# Patient Record
Sex: Male | Born: 1990 | Race: White | Hispanic: Yes | Marital: Single | Smoking: Never smoker
Health system: Southern US, Community
[De-identification: ages and names within clinical notes are randomized; demographics above are authoritative.]

---
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
## 2011-04-19 ENCOUNTER — Emergency Department (HOSPITAL_COMMUNITY)
Admission: EM | Admit: 2011-04-19 | Discharge: 2011-04-19 | Disposition: A | Discharge status: Home / Self Care | Payer: Self-pay | Attending: Emergency Medicine | Admitting: Emergency Medicine

## 2011-04-19 ENCOUNTER — Emergency Department (HOSPITAL_COMMUNITY): Payer: Self-pay

## 2011-04-19 DIAGNOSIS — R05 Cough: Secondary | ICD-10-CM | POA: Insufficient documentation

## 2011-04-19 DIAGNOSIS — R509 Fever, unspecified: Secondary | ICD-10-CM | POA: Insufficient documentation

## 2011-04-19 DIAGNOSIS — R51 Headache: Secondary | ICD-10-CM | POA: Insufficient documentation

## 2011-04-19 DIAGNOSIS — J029 Acute pharyngitis, unspecified: Secondary | ICD-10-CM | POA: Insufficient documentation

## 2011-04-19 DIAGNOSIS — R079 Chest pain, unspecified: Secondary | ICD-10-CM | POA: Insufficient documentation

## 2011-04-19 DIAGNOSIS — R059 Cough, unspecified: Secondary | ICD-10-CM | POA: Insufficient documentation

## 2011-04-19 LAB — CBC
MCH: 30.9 pg (ref 26.0–34.0)
MCHC: 34.4 g/dL (ref 30.0–36.0)
Platelets: 204 10*3/uL (ref 150–400)
RBC: 4.33 MIL/uL (ref 4.22–5.81)
RDW: 12.5 % (ref 11.5–15.5)
WBC: 6.4 10*3/uL (ref 4.0–10.5)

## 2011-04-19 LAB — DIFFERENTIAL
Basophils Absolute: 0 10*3/uL (ref 0.0–0.1)
Eosinophils Absolute: 0 10*3/uL (ref 0.0–0.7)
Monocytes Absolute: 1 10*3/uL (ref 0.1–1.0)
Neutro Abs: 4.7 10*3/uL (ref 1.7–7.7)

## 2011-04-19 LAB — COMPREHENSIVE METABOLIC PANEL
AST: 39 U/L — ABNORMAL HIGH (ref 0–37)
Albumin: 3.4 g/dL — ABNORMAL LOW (ref 3.5–5.2)
CO2: 24 mEq/L (ref 19–32)
Calcium: 8.3 mg/dL — ABNORMAL LOW (ref 8.4–10.5)
Chloride: 103 mEq/L (ref 96–112)
Creatinine, Ser: 0.96 mg/dL (ref 0.4–1.5)
GFR calc Af Amer: >60 mL/min (ref >60)
Potassium: 3.4 mEq/L — ABNORMAL LOW (ref 3.5–5.1)
Total Bilirubin: 0.4 mg/dL (ref 0.3–1.2)

## 2011-04-19 LAB — URINE MICROSCOPIC-ADD ON

## 2011-04-19 LAB — URINALYSIS, ROUTINE W REFLEX MICROSCOPIC
Leukocytes, UA: NEGATIVE
Protein, ur: NEGATIVE mg/dL
Urobilinogen, UA: 0.2 mg/dL (ref 0.0–1.0)

## 2011-04-19 LAB — RAPID STREP SCREEN (MED CTR MEBANE ONLY): Streptococcus, Group A Screen (Direct): NEGATIVE

## 2020-09-06 ENCOUNTER — Emergency Department (HOSPITAL_BASED_OUTPATIENT_CLINIC_OR_DEPARTMENT_OTHER)
Admission: EM | Admit: 2020-09-06 | Discharge: 2020-09-06 | Disposition: A | Discharge status: Home / Self Care | Payer: BC Managed Care – PPO | Attending: Emergency Medicine | Admitting: Emergency Medicine

## 2020-09-06 ENCOUNTER — Emergency Department (HOSPITAL_BASED_OUTPATIENT_CLINIC_OR_DEPARTMENT_OTHER): Payer: BC Managed Care – PPO

## 2020-09-06 ENCOUNTER — Encounter (HOSPITAL_BASED_OUTPATIENT_CLINIC_OR_DEPARTMENT_OTHER): Payer: Self-pay

## 2020-09-06 ENCOUNTER — Other Ambulatory Visit: Payer: Self-pay

## 2020-09-06 DIAGNOSIS — K5900 Constipation, unspecified: Secondary | ICD-10-CM | POA: Diagnosis not present

## 2020-09-06 DIAGNOSIS — R1084 Generalized abdominal pain: Secondary | ICD-10-CM | POA: Diagnosis present

## 2020-09-06 LAB — COMPREHENSIVE METABOLIC PANEL
ALT: 165 U/L — ABNORMAL HIGH (ref 0–44)
AST: 82 U/L — ABNORMAL HIGH (ref 15–41)
Albumin: 4.4 g/dL (ref 3.5–5.0)
Alkaline Phosphatase: 87 U/L (ref 38–126)
Anion gap: 11 (ref 5–15)
BUN: 12 mg/dL (ref 6–20)
CO2: 25 mmol/L (ref 22–32)
Calcium: 9.3 mg/dL (ref 8.9–10.3)
Chloride: 101 mmol/L (ref 98–111)
Creatinine, Ser: 0.85 mg/dL (ref 0.61–1.24)
GFR calc Af Amer: >60 mL/min (ref >60)
GFR calc non Af Amer: >60 mL/min (ref >60)
Glucose, Bld: 110 mg/dL — ABNORMAL HIGH (ref 70–99)
Potassium: 3.7 mmol/L (ref 3.5–5.1)
Sodium: 137 mmol/L (ref 135–145)
Total Bilirubin: 0.5 mg/dL (ref 0.3–1.2)
Total Protein: 7.9 g/dL (ref 6.5–8.1)

## 2020-09-06 LAB — CBC
HCT: 44.7 % (ref 39.0–52.0)
Hemoglobin: 15.2 g/dL (ref 13.0–17.0)
MCH: 30.9 pg (ref 26.0–34.0)
MCHC: 34 g/dL (ref 30.0–36.0)
MCV: 90.9 fL (ref 80.0–100.0)
Platelets: 311 10*3/uL (ref 150–400)
RBC: 4.92 MIL/uL (ref 4.22–5.81)
RDW: 12.3 % (ref 11.5–15.5)
WBC: 8.6 10*3/uL (ref 4.0–10.5)
nRBC: 0 % (ref 0.0–0.2)

## 2020-09-06 LAB — URINALYSIS, ROUTINE W REFLEX MICROSCOPIC
Bilirubin Urine: NEGATIVE
Glucose, UA: NEGATIVE mg/dL
Hgb urine dipstick: NEGATIVE
Ketones, ur: NEGATIVE mg/dL
Leukocytes,Ua: NEGATIVE
Nitrite: NEGATIVE
Protein, ur: NEGATIVE mg/dL
Specific Gravity, Urine: 1.01 (ref 1.005–1.030)
pH: 7.5 (ref 5.0–8.0)

## 2020-09-06 LAB — LIPASE, BLOOD: Lipase: 37 U/L (ref 11–51)

## 2020-09-06 MED ORDER — ACETAMINOPHEN 500 MG PO TABS
1000.0000 mg | ORAL_TABLET | Freq: Once | ORAL | Status: AC
  Administered 2020-09-06: 1000 mg via ORAL
  Filled 2020-09-06: qty 2

## 2020-09-06 MED ORDER — LIDOCAINE 5 % EX PTCH
2.0000 | MEDICATED_PATCH | CUTANEOUS | Status: DC
  Administered 2020-09-06: 2 via TRANSDERMAL
  Filled 2020-09-06: qty 2

## 2020-09-06 MED ORDER — IBUPROFEN 400 MG PO TABS
400.0000 mg | ORAL_TABLET | Freq: Once | ORAL | Status: AC
  Administered 2020-09-06: 400 mg via ORAL
  Filled 2020-09-06: qty 1

## 2020-09-06 MED ORDER — POLYETHYLENE GLYCOL 3350 17 G PO PACK: 17.0000 g | PACK | Freq: Every day | ORAL | 0 refills | Status: DC

## 2020-09-06 NOTE — ED Triage Notes (Signed)
Pt c/o abd pain, constipation-sx started 9/18-states pain feels like "sore abs"-NAD-steady gait

## 2020-09-06 NOTE — ED Provider Notes (Signed)
[facility] EMERGENCY DEPARTMENT Provider Note   CSN: [id number] Arrival date & time: 09/06/20  2011     History Chief Complaint  Patient presents with  . Abdominal Pain    Andre Bryant is a 29 y.o. male.  The history is provided by the patient.  Abdominal Pain Pain location:  Generalized Pain radiates to:  Does not radiate Pain severity:  Moderate Onset quality:  Gradual Timing:  Constant Progression:  Unchanged Chronicity:  New Context: not eating, not laxative use, not sick contacts, not suspicious food intake and not trauma   Relieved by:  Nothing Worsened by:  Nothing Ineffective treatments:  None tried Associated symptoms: constipation   Associated symptoms: no anorexia, no belching, no chest pain, no chills, no cough, no dysuria, no fatigue, no fever, no flatus, no hematemesis, no hematochezia, no melena, no nausea, no shortness of breath, no sore throat and no vomiting   Risk factors: no alcohol abuse   Patient has been constipated since the 18th, no Bm.  Also feels sore in the abdomen but hasn't taken anything.  No f/c/r.  No n/v/d.  No urinary symptoms.       History reviewed. No pertinent past medical history.  There are no problems to display for this patient.   History reviewed. No pertinent surgical history.     History reviewed. No pertinent family history.  Social History   Tobacco Use  . Smoking status: Never Smoker  . Smokeless tobacco: Never Used  Vaping Use  . Vaping Use: Never used  Substance Use Topics  . Alcohol use: Yes    Comment: weekly  . Drug use: Never    Home Medications Prior to Admission medications   Not on File    Allergies    Patient has no known allergies.  Review of Systems   Review of Systems  Constitutional: Negative for chills, fatigue and fever.  HENT: Negative for sore throat.   Eyes: Negative for visual disturbance.  Respiratory: Negative for cough and shortness of breath.     Cardiovascular: Negative for chest pain.  Gastrointestinal: Positive for abdominal pain and constipation. Negative for anorexia, flatus, hematemesis, hematochezia, melena, nausea and vomiting.  Genitourinary: Negative for dysuria.  Musculoskeletal: Negative for arthralgias.  Neurological: Negative for dizziness.  Psychiatric/Behavioral: Negative for agitation.  All other systems reviewed and are negative.   Physical Exam Updated Vital Signs BP 115/65   Pulse 64   Temp 98.3 F (36.8 C) (Oral)   Resp 16   Ht 5\' 10"  (1.778 m)   Wt 103.4 kg   SpO2 99%   BMI 32.71 kg/m   Physical Exam Vitals and nursing note reviewed.  Constitutional:      General: He is not in acute distress.    Appearance: Normal appearance.  HENT:     Head: Normocephalic and atraumatic.     Nose: Nose normal.  Eyes:     Conjunctiva/sclera: Conjunctivae normal.     Pupils: Pupils are equal, round, and reactive to light.  Cardiovascular:     Rate and Rhythm: Normal rate and regular rhythm.     Pulses: Normal pulses.     Heart sounds: Normal heart sounds.  Pulmonary:     Effort: Pulmonary effort is normal.     Breath sounds: Normal breath sounds.  Abdominal:     General: Abdomen is flat. Bowel sounds are normal.     Palpations: Abdomen is soft.     Tenderness: There is no abdominal  tenderness. There is no guarding or rebound.  Musculoskeletal:        General: Normal range of motion.     Cervical back: Normal range of motion and neck supple.  Skin:    General: Skin is warm and dry.     Capillary Refill: Capillary refill takes less than 2 seconds.  Neurological:     General: No focal deficit present.     Mental Status: He is alert and oriented to person, place, and time.     Deep Tendon Reflexes: Reflexes normal.  Psychiatric:        Mood and Affect: Mood normal.        Behavior: Behavior normal.     ED Results / Procedures / Treatments   Labs (all labs ordered are listed, but only abnormal  results are displayed) Results for orders placed or performed during the hospital encounter of 09/06/20  Lipase, blood  Result Value Ref Range   Lipase 37 11 - 51 U/L  Comprehensive metabolic panel  Result Value Ref Range   Sodium 137 135 - 145 mmol/L   Potassium 3.7 3.5 - 5.1 mmol/L   Chloride 101 98 - 111 mmol/L   CO2 25 22 - 32 mmol/L   Glucose, Bld 110 (H) 70 - 99 mg/dL   BUN 12 6 - 20 mg/dL   Creatinine, Ser 7.37 0.61 - 1.24 mg/dL   Calcium 9.3 8.9 - 10.6 mg/dL   Total Protein 7.9 6.5 - 8.1 g/dL   Albumin 4.4 3.5 - 5.0 g/dL   AST 82 (H) 15 - 41 U/L   ALT 165 (H) 0 - 44 U/L   Alkaline Phosphatase 87 38 - 126 U/L   Total Bilirubin 0.5 0.3 - 1.2 mg/dL   GFR calc non Af Amer >60 >60 mL/min   GFR calc Af Amer >60 >60 mL/min   Anion gap 11 5 - 15  CBC  Result Value Ref Range   WBC 8.6 4.0 - 10.5 K/uL   RBC 4.92 4.22 - 5.81 MIL/uL   Hemoglobin 15.2 13.0 - 17.0 g/dL   HCT 26.9 39 - 52 %   MCV 90.9 80.0 - 100.0 fL   MCH 30.9 26.0 - 34.0 pg   MCHC 34.0 30.0 - 36.0 g/dL   RDW 48.5 46.2 - 70.3 %   Platelets 311 150 - 400 K/uL   nRBC 0.0 0.0 - 0.2 %  Urinalysis, Routine w reflex microscopic Urine, Clean Catch  Result Value Ref Range   Color, Urine YELLOW YELLOW   APPearance CLEAR CLEAR   Specific Gravity, Urine 1.010 1.005 - 1.030   pH 7.5 5.0 - 8.0   Glucose, UA NEGATIVE NEGATIVE mg/dL   Hgb urine dipstick NEGATIVE NEGATIVE   Bilirubin Urine NEGATIVE NEGATIVE   Ketones, ur NEGATIVE NEGATIVE mg/dL   Protein, ur NEGATIVE NEGATIVE mg/dL   Nitrite NEGATIVE NEGATIVE   Leukocytes,Ua NEGATIVE NEGATIVE   DG Abdomen Acute W/Chest  Result Date: 09/06/2020 CLINICAL DATA:  Constipation EXAM: ACUTE ABDOMEN SERIES (2 VIEW ABDOMEN AND 1 VIEW CHEST) COMPARISON:  None. FINDINGS: Lungs are clear. No pneumothorax or pleural effusion. Cardiac size within normal limits. Pulmonary vascularity is normal. Normal abdominal gas pattern. No free intraperitoneal gas. No organomegaly. No abnormal  calcifications within the abdomen and pelvis. Osseous structures are unremarkable. IMPRESSION: Lungs are clear. Normal abdominal gas pattern. Electronically Signed   By: Helyn Numbers MD   On: 09/06/2020 23:15    Radiology DG Abdomen Acute W/Chest  Result  Date: 09/06/2020 CLINICAL DATA:  Constipation EXAM: ACUTE ABDOMEN SERIES (2 VIEW ABDOMEN AND 1 VIEW CHEST) COMPARISON:  None. FINDINGS: Lungs are clear. No pneumothorax or pleural effusion. Cardiac size within normal limits. Pulmonary vascularity is normal. Normal abdominal gas pattern. No free intraperitoneal gas. No organomegaly. No abnormal calcifications within the abdomen and pelvis. Osseous structures are unremarkable. IMPRESSION: Lungs are clear. Normal abdominal gas pattern. Electronically Signed   By: Helyn Numbers MD   On: 09/06/2020 23:15    Procedures Procedures (including critical care time)  Medications Ordered in ED Medications  acetaminophen (TYLENOL) tablet 1,000 mg (has no administration in time range)  ibuprofen (ADVIL) tablet 400 mg (has no administration in time range)  lidocaine (LIDODERM) 5 % 2 patch (has no administration in time range)    ED Course  I have reviewed the triage vital signs and the nursing notes.  Pertinent labs & imaging results that were available during my care of the patient were reviewed by me and considered in my medical decision making (see chart for details).    Well appearing. Exam vitals and labs are reassuring.  No signs of a surgical abdomen.  No fevers.  I do not believe this patient has appendicitis or biliary colic.  I will start miralax daily and have advised tylenol for pain.  Strict abdominal pain return precautions were given.    JERMAYNE SWEENEY was evaluated in Emergency Department on 09/06/2020 for the symptoms described in the history of present illness. He was evaluated in the context of the global COVID-19 pandemic, which necessitated consideration that the patient might be  at risk for infection with the SARS-CoV-2 virus that causes COVID-19. Institutional protocols and algorithms that pertain to the evaluation of patients at risk for COVID-19 are in a state of rapid change based on information released by regulatory bodies including the CDC and federal and state organizations. These policies and algorithms were followed during the patient's care in the ED.   Final Clinical Impression(s) / ED Diagnoses Return for intractable cough, coughing up blood,fevers >100.4 unrelieved by medication, shortness of breath, intractable vomiting, chest pain, shortness of breath, weakness,numbness, changes in speech, facial asymmetry,abdominal pain, passing out,Inability to tolerate liquids or food, cough, altered mental status or any concerns. No signs of systemic illness or infection. The patient is nontoxic-appearing on exam and vital signs are within normal limits.   I have reviewed the triage vital signs and the nursing notes. Pertinent labs &imaging results that were available during my care of the patient were reviewed by me and considered in my medical decision making (see chart for details).After history, exam, and medical workup I feel the patient has beenappropriately medically screened and is safe for discharge home. Pertinent diagnoses were discussed with the patient. Patient was given return precautions.   Ikesha Siller, MD 09/06/20 [id number]

## 2021-04-13 IMAGING — DX DG ABDOMEN ACUTE W/ 1V CHEST
4 series · 4 of 4 positions shown · non-contrast
Comparison: None.

CLINICAL DATA: Constipation

EXAM:
ACUTE ABDOMEN SERIES (2 VIEW ABDOMEN AND 1 VIEW CHEST)

[abdomen supine (1 of 2)]
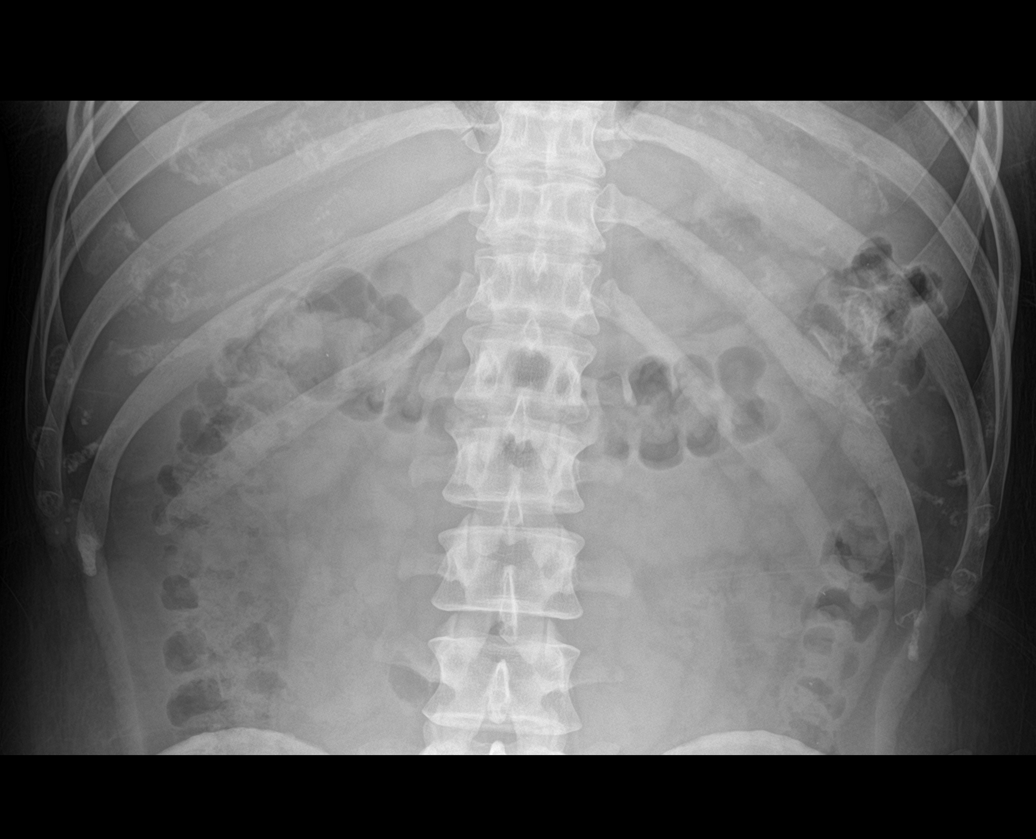

[chest pa]
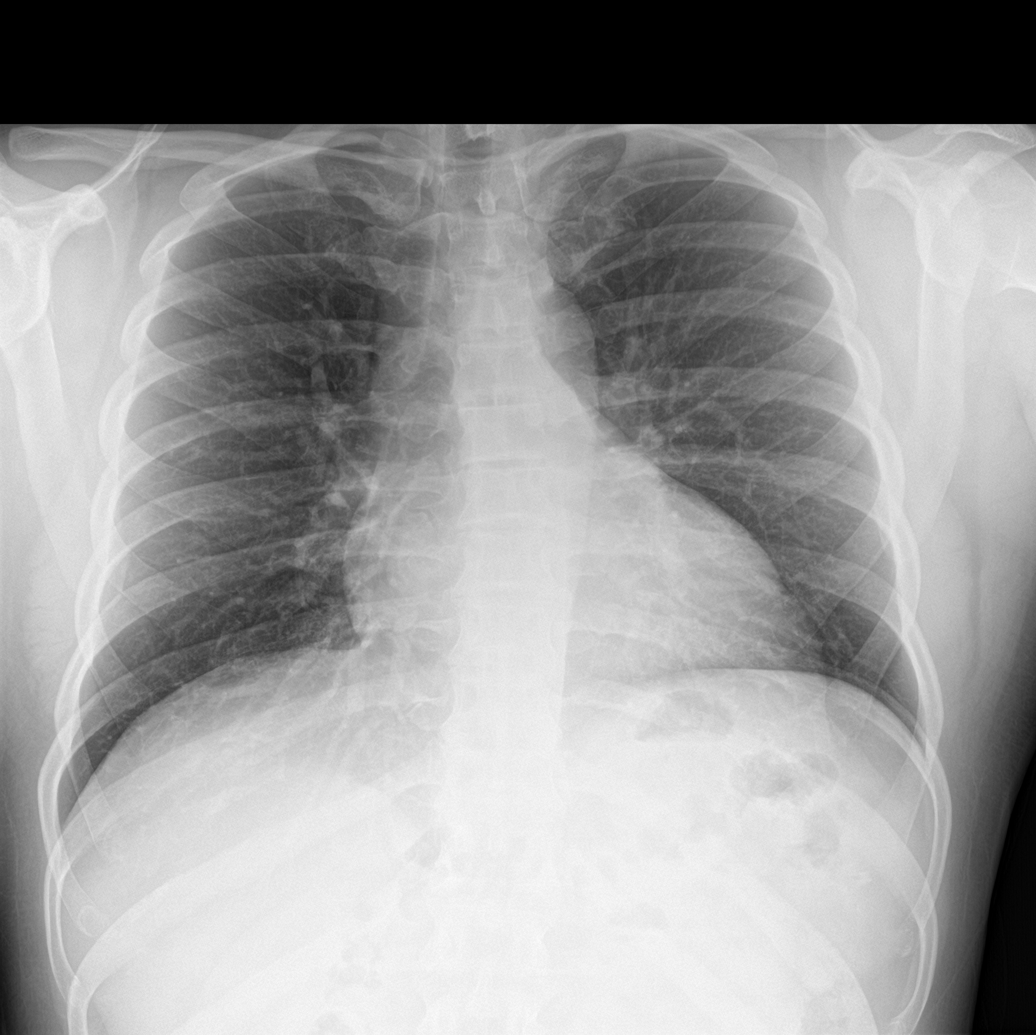

[abdomen erect]
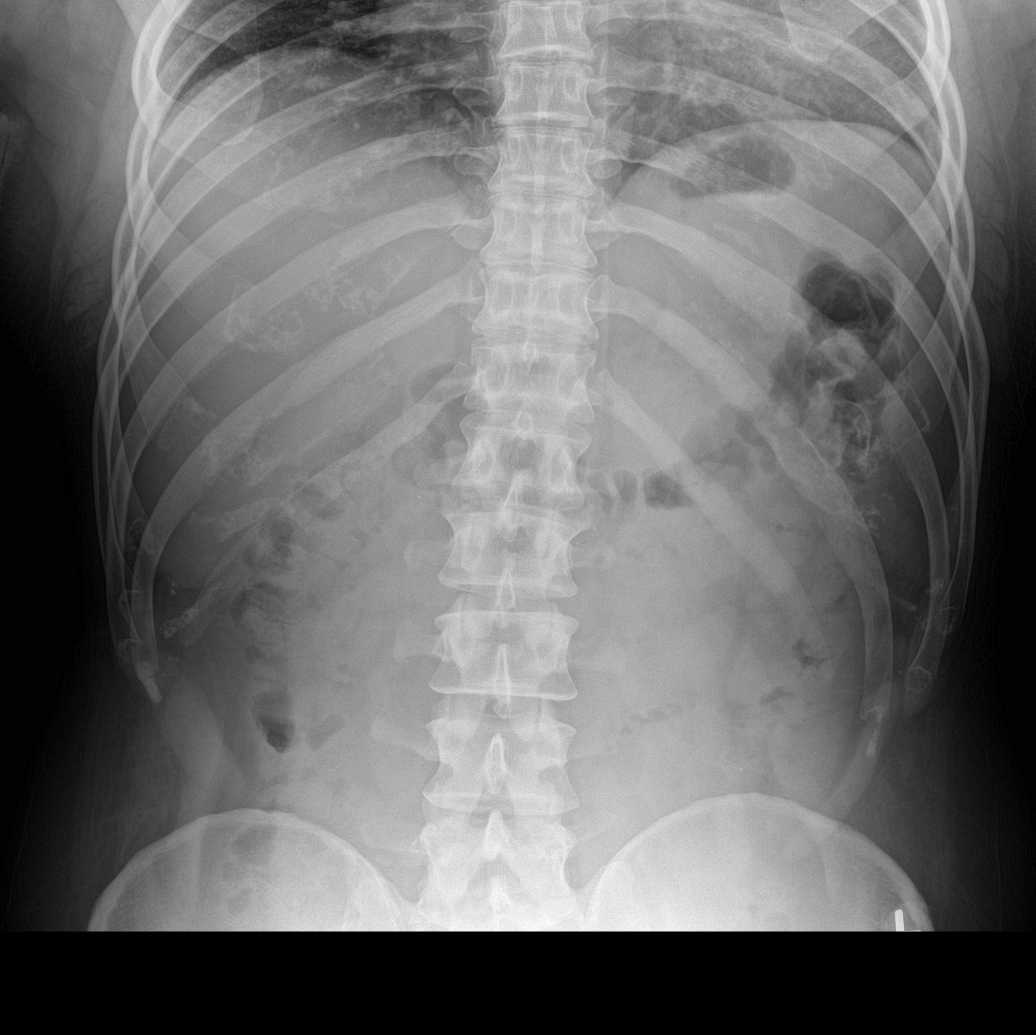

[abdomen supine (2 of 2)]
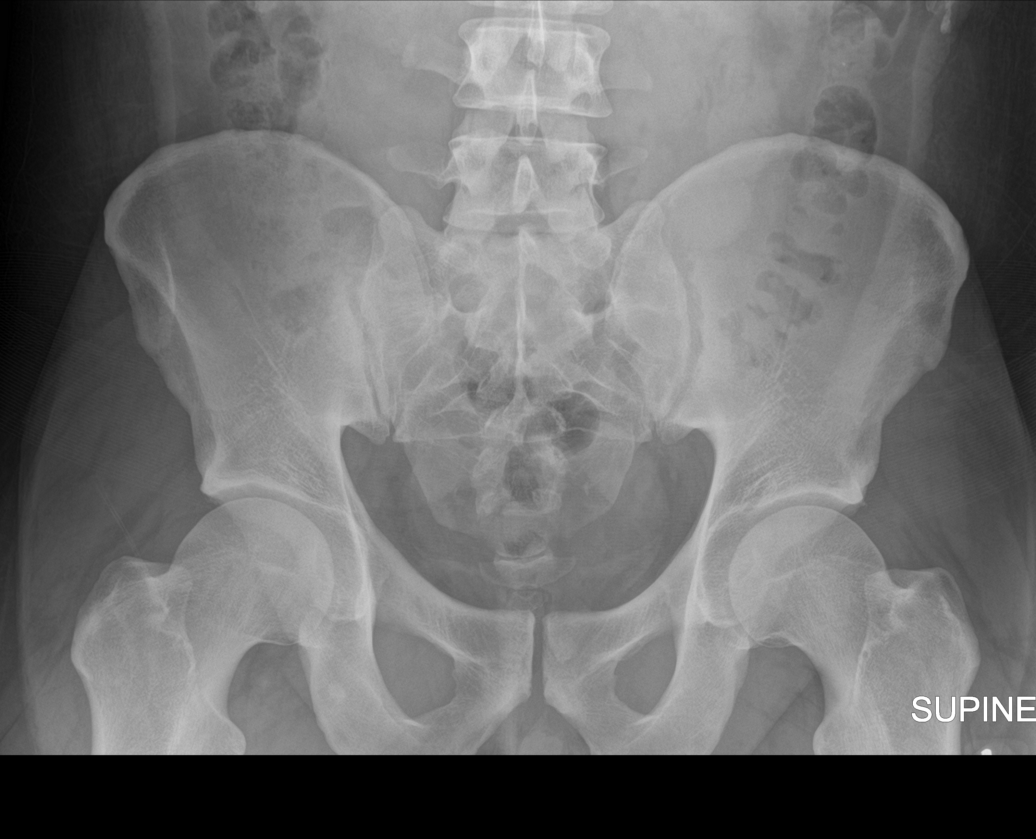

[4 of 4 positions shown; findings below may reference images not displayed]

FINDINGS: Lungs are clear. No pneumothorax or pleural effusion. Cardiac size
within normal limits. Pulmonary vascularity is normal.

Normal abdominal gas pattern. No free intraperitoneal gas. No
organomegaly. No abnormal calcifications within the abdomen and
pelvis. Osseous structures are unremarkable.
IMPRESSION: Lungs are clear.

Normal abdominal gas pattern.

## 2024-10-04 ENCOUNTER — Encounter: Payer: Self-pay | Admitting: Family Medicine

## 2024-10-04 ENCOUNTER — Ambulatory Visit: Payer: Self-pay | Admitting: Family Medicine

## 2024-10-04 ENCOUNTER — Ambulatory Visit: Payer: Self-pay

## 2024-10-04 ENCOUNTER — Ambulatory Visit (INDEPENDENT_AMBULATORY_CARE_PROVIDER_SITE_OTHER): Admitting: Family Medicine

## 2024-10-04 ENCOUNTER — Ambulatory Visit (INDEPENDENT_AMBULATORY_CARE_PROVIDER_SITE_OTHER)

## 2024-10-04 VITALS — BP 118/76 | HR 61 | Temp 98.0°F | Ht 70.0 in | Wt 229.6 lb

## 2024-10-04 DIAGNOSIS — M5442 Lumbago with sciatica, left side: Secondary | ICD-10-CM | POA: Diagnosis not present

## 2024-10-04 DIAGNOSIS — Z7689 Persons encountering health services in other specified circumstances: Secondary | ICD-10-CM

## 2024-10-04 LAB — COMPREHENSIVE METABOLIC PANEL WITH GFR
ALT: 141 U/L — ABNORMAL HIGH (ref 0–53)
AST: 70 U/L — ABNORMAL HIGH (ref 0–37)
Albumin: 4.7 g/dL (ref 3.5–5.2)
Alkaline Phosphatase: 93 U/L (ref 39–117)
BUN: 10 mg/dL (ref 6–23)
CO2: 28 meq/L (ref 19–32)
Calcium: 9.4 mg/dL (ref 8.4–10.5)
Chloride: 104 meq/L (ref 96–112)
Creatinine, Ser: 0.78 mg/dL (ref 0.40–1.50)
GFR: 116.29 mL/min (ref >60.00)
Glucose, Bld: 76 mg/dL (ref 70–99)
Potassium: 3.7 meq/L (ref 3.5–5.1)
Sodium: 139 meq/L (ref 135–145)
Total Bilirubin: 0.4 mg/dL (ref 0.2–1.2)
Total Protein: 7.8 g/dL (ref 6.0–8.3)

## 2024-10-04 LAB — CBC WITH DIFFERENTIAL/PLATELET
Basophils Absolute: 0 K/uL (ref 0.0–0.1)
Basophils Relative: 0.4 % (ref 0.0–3.0)
Eosinophils Absolute: 0.2 K/uL (ref 0.0–0.7)
Eosinophils Relative: 2.9 % (ref 0.0–5.0)
HCT: 44.9 % (ref 39.0–52.0)
Hemoglobin: 15.1 g/dL (ref 13.0–17.0)
Lymphocytes Relative: 27.1 % (ref 12.0–46.0)
Lymphs Abs: 1.9 K/uL (ref 0.7–4.0)
MCHC: 33.5 g/dL (ref 30.0–36.0)
MCV: 91.7 fl (ref 78.0–100.0)
Monocytes Absolute: 0.6 K/uL (ref 0.1–1.0)
Monocytes Relative: 8.1 % (ref 3.0–12.0)
Neutro Abs: 4.4 K/uL (ref 1.4–7.7)
Neutrophils Relative %: 61.5 % (ref 43.0–77.0)
Platelets: 292 K/uL (ref 150.0–400.0)
RBC: 4.89 Mil/uL (ref 4.22–5.81)
RDW: 13.2 % (ref 11.5–15.5)
WBC: 7.1 K/uL (ref 4.0–10.5)

## 2024-10-04 NOTE — Patient Instructions (Addendum)
 I have attached exercises to try for your back.   I have sent in meloxicam for you to take once daily. This is an anti-inflammatory.   Do not take ibuprofen , Aleve, aspirin with this medication. You may take Tylenol  with this medication.  Eat with this medication, it can upset your stomach if you do not.  We are getting an xray today. We will be in contact with any abnormal results that require further attention.  We are checking labs today, will be in contact with any results that require further attention  Follow-up with me for new or worsening symptoms.

## 2024-10-04 NOTE — Progress Notes (Signed)
 New Patient Visit  Subjective:     Patient ID: Andre Bryant, male    DOB: 01/13/1991, 33 y.o.   MRN: [id number]  No chief complaint on file.   HPI  Discussed the use of AI scribe software for clinical note transcription with the patient, who gave verbal consent to proceed.  History of Present Illness Andre Bryant is a 33 year old male who presents with lower back pain.  Lower back pain - Onset four weeks ago with sudden onset - Persistent pain localized to a 'strip' in the lower back - Locking sensation in the lower back - Pain restricts movement, especially when lifting his leg - Tingling sensation present, but no numbness - History of a pinched nerve, initially suspected as the cause  Prior interventions and pain management - Completed six chiropractic treatments over five weeks without significant relief - Received one deep tissue massage without significant relief - Uses heat and ice for pain management - Has not taken any medications, including prescribed muscle relaxers  Alcohol use - Consumes a case of alcohol per week - Does not consider this amount excessive     ROS Per HPI  Outpatient Encounter Medications as of 10/04/2024  Medication Sig   [DISCONTINUED] polyethylene glycol (MIRALAX ) 17 g packet Take 17 g by mouth daily. (Patient not taking: Reported on 10/04/2024)   No facility-administered encounter medications on file as of 10/04/2024.    History reviewed. No pertinent past medical history.  History reviewed. No pertinent surgical history.  History reviewed. No pertinent family history.  Social History   Socioeconomic History   Marital status: Single    Spouse name: Not on file   Number of children: Not on file   Years of education: Not on file   Highest education level: Not on file  Occupational History   Not on file  Tobacco Use   Smoking status: Never   Smokeless tobacco: Never  Vaping Use   Vaping status: Never Used   Substance and Sexual Activity   Alcohol use: Yes    Comment: weekly   Drug use: Never   Sexual activity: Not on file  Other Topics Concern   Not on file  Social History Narrative   Not on file   Social Drivers of Health   Financial Resource Strain: Not on file  Food Insecurity: Not on file  Transportation Needs: Not on file  Physical Activity: Not on file  Stress: Not on file  Social Connections: Unknown (04/29/2022)   Received from [facility]   Social Network    Social Network: Not on file  Intimate Partner Violence: Unknown (03/21/2022)   Received from [facility]   HITS    Physically Hurt: Not on file    Insult or Talk Down To: Not on file    Threaten Physical Harm: Not on file    Scream or Curse: Not on file       Objective:    BP 118/76 (BP Location: Left Arm, Patient Position: Sitting)   Pulse 61   Temp 98 F (36.7 C) (Temporal)   Ht 5' 10 (1.778 m)   Wt 229 lb 9.6 oz (104.1 kg)   SpO2 98%   BMI 32.94 kg/m    Physical Exam Vitals and nursing note reviewed.  Constitutional:      General: He is not in acute distress.    Appearance: Normal appearance.  HENT:     Head: Normocephalic and atraumatic.  Right Ear: External ear normal.     Left Ear: External ear normal.     Nose: Nose normal.     Mouth/Throat:     Mouth: Mucous membranes are moist.     Pharynx: Oropharynx is clear.  Eyes:     Extraocular Movements: Extraocular movements intact.  Cardiovascular:     Rate and Rhythm: Normal rate and regular rhythm.     Pulses: Normal pulses.     Heart sounds: Normal heart sounds.  Pulmonary:     Effort: Pulmonary effort is normal. No respiratory distress.     Breath sounds: Normal breath sounds. No wheezing, rhonchi or rales.  Musculoskeletal:     Cervical back: Normal range of motion.     Right lower leg: No edema.     Left lower leg: No edema.     Comments: Mild LROM with position changes, + SLR to Left leg  Lymphadenopathy:     Cervical:  No cervical adenopathy.  Skin:    General: Skin is warm and dry.  Neurological:     General: No focal deficit present.     Mental Status: He is alert and oriented to person, place, and time.  Psychiatric:        Mood and Affect: Mood normal.        Behavior: Behavior normal.     No results found for any visits on 10/04/24.      Assessment & Plan:   Assessment and Plan Assessment & Plan Low back pain with left sided sciatica Low back pain with left  leg radiculopathy, likely sciatica, persisting for four weeks. Previous chiropractic treatment and deep tissue massage have not provided relief. Differential diagnosis includes nerve impingement or inflammation. - Order x-ray of the lumbar spine. - Prescribe meloxicam 15 mg once daily with food for two weeks. - Advise against concurrent use of ibuprofen , Aleve, or aspirin while on meloxicam. - Recommend heat application over ice for pain relief. - Provide stretching exercises with caution to avoid sharp pain. - Consider referral to sports medicine or physical therapy if needed. - Consider MRI if x-ray results are inconclusive or symptoms persist.  General Health Maintenance No recent blood work performed. - Order CBC and CMP to evaluate blood cells, blood sugar, electrolytes, liver, and kidney function.     Orders Placed This Encounter  Procedures   DG Lumbar Spine 2-3 Views    Standing Status:   Future    Number of Occurrences:   1    Expiration Date:   04/04/2025    Reason for Exam (SYMPTOM  OR DIAGNOSIS REQUIRED):   left low back pain    Preferred imaging location?:   [facility]   CBC with Differential/Platelet    Release to patient:   Immediate [1]   Comprehensive metabolic panel with GFR    Release to patient:   Immediate [1]     No orders of the defined types were placed in this encounter.   Return if symptoms worsen or fail to improve.  Corean LITTIE Ku, FNP

## 2024-10-04 NOTE — Telephone Encounter (Signed)
 FYI Only or Action Required?: FYI only for provider.  Patient was last seen in primary care on NP .  Called Nurse Triage reporting Back Pain.  Symptoms began several weeks ago.  Interventions attempted: Rest, hydration, or home remedies.  Symptoms are: gradually worsening.  Triage Disposition: See HCP Within 4 Hours (Or PCP Triage)  Patient/caregiver understands and will follow disposition?: Yes       Copied from CRM [id number]. Topic: Clinical - Red Word Triage >> Oct 04, 2024  8:23 AM [name] wrote: [facility] that prompted transfer to Nurse Triage: Patient is complaining of lower back pain, was seeing a chiropractor and has  not made any progress. Reason for Disposition  [1] SEVERE back pain (e.g., excruciating, unable to do any normal activities) AND [2] not improved 2 hours after pain medicine  Answer Assessment - Initial Assessment Questions 1. ONSET: When did the pain begin? (e.g., minutes, hours, days)     X 5 weeks 2. LOCATION: Where does it hurt? (upper, mid or lower back)     Left side, low back 3. SEVERITY: How bad is the pain?  (e.g., Scale 1-10; mild, moderate, or severe)     10/10 at worst 4. PATTERN: Is the pain constant? (e.g., yes, no; constant, intermittent)      Constant, worsening with movement 5. RADIATION: Does the pain shoot into your legs or somewhere else?     Left leg 6. CAUSE:  What do you think is causing the back pain?      Believes it may be a pinched nerve 8. MEDICINES: What have you taken so far for the pain? (e.g., nothing, acetaminophen , NSAIDS)     Nothing 9. NEUROLOGIC SYMPTOMS: Do you have any weakness, numbness, or problems with bowel/bladder control?     None 10. OTHER SYMPTOMS: Do you have any other symptoms? (e.g., fever, abdomen pain, burning with urination, blood in urine)       Increase in urination  Protocols used: Back Pain-A-AH

## 2024-10-05 ENCOUNTER — Other Ambulatory Visit: Payer: Self-pay

## 2024-10-05 ENCOUNTER — Telehealth: Payer: Self-pay

## 2024-10-05 DIAGNOSIS — M5442 Lumbago with sciatica, left side: Secondary | ICD-10-CM

## 2024-10-05 MED ORDER — MELOXICAM 15 MG PO TABS: ORAL_TABLET | ORAL | 0 refills | Status: AC

## 2024-10-05 NOTE — Telephone Encounter (Signed)
 Spoke with patient, went over his lab results. He asked what he can take for his pain, provider mentioned in note he could have meloxicam 15mg  daily for 2 weeks. Sent medication in for patient, advised he not take ibuprofen , aleve, or aspirin with medication. Patient gave verbal understanding. Lab f/u scheduled as well

## 2024-10-05 NOTE — Telephone Encounter (Signed)
 Copied from CRM #[id number]. Topic: Clinical - Lab/Test Results >> Oct 04, 2024  5:06 PM [place] J wrote: Reason for CRM: Patient is calling in for his lab results. I was able to successfully relay his blood work but he is still needing his imaging results relayed.

## 2024-10-08 ENCOUNTER — Telehealth: Payer: Self-pay | Admitting: Family Medicine

## 2024-10-08 DIAGNOSIS — R7989 Other specified abnormal findings of blood chemistry: Secondary | ICD-10-CM

## 2024-10-08 NOTE — Telephone Encounter (Signed)
 Pt scheduled for a lab visit 12/4 with NO orders.  Please enter lab orders if appropriate

## 2024-10-12 ENCOUNTER — Other Ambulatory Visit

## 2024-10-12 NOTE — Telephone Encounter (Signed)
 Orders in

## 2024-10-19 ENCOUNTER — Ambulatory Visit
Admission: RE | Admit: 2024-10-19 | Discharge: 2024-10-19 | Disposition: A | Source: Ambulatory Visit | Attending: Family Medicine

## 2024-10-19 DIAGNOSIS — M5442 Lumbago with sciatica, left side: Secondary | ICD-10-CM

## 2024-10-25 ENCOUNTER — Ambulatory Visit: Payer: Self-pay | Admitting: Family Medicine

## 2024-10-25 ENCOUNTER — Other Ambulatory Visit: Payer: Self-pay

## 2024-10-25 DIAGNOSIS — M5442 Lumbago with sciatica, left side: Secondary | ICD-10-CM

## 2024-11-16 ENCOUNTER — Ambulatory Visit: Admitting: Physical Medicine and Rehabilitation

## 2024-11-16 ENCOUNTER — Encounter: Payer: Self-pay | Admitting: Physical Medicine and Rehabilitation

## 2024-11-16 DIAGNOSIS — M5416 Radiculopathy, lumbar region: Secondary | ICD-10-CM

## 2024-11-16 DIAGNOSIS — M5116 Intervertebral disc disorders with radiculopathy, lumbar region: Secondary | ICD-10-CM

## 2024-11-16 DIAGNOSIS — G8929 Other chronic pain: Secondary | ICD-10-CM

## 2024-11-16 NOTE — Progress Notes (Unsigned)
 Pain Scale   Average Pain 3 Patient advising he has lower back pain that comes and goes ant different times of day. Patient her for MRI review.        +Driver, -BT, -Dye Allergies.

## 2024-11-16 NOTE — Progress Notes (Unsigned)
 Core Outcome Measures Index (COMI) Back Score  Average Pain 7  COMI Score 70%

## 2024-11-16 NOTE — Progress Notes (Unsigned)
 Andre Bryant - 33 y.o. male MRN [id number]  Date of birth: 1991/08/17  Office Visit Note: Visit Date: 11/16/2024 PCP: Alvia Corean CROME, FNP Referred by: Alvia Corean CROME, *  Subjective: Chief Complaint  Patient presents with   Lower Back - Pain   HPI: Andre Bryant is a 33 y.o. male who comes in today per the request of Corean Alvia, NP for evaluation of chronic left sided lower back pain radiating to buttock and down leg. Started several months ago, no specific injury. His pain has improved, however continues to be bothersome. His pain is intermittent, describes as sharp and stabbing sensation, currently rates as 8 out of 10. He does report increased pain with sitting for prolonged periods. Also reports intermittent numbness/tingling to left leg. Some relief of pain with home exercise regimen, rest and use of medications. Minimal relief of pain with recent chiropractic treatments. Recent lumbar MRI imaging shows broad-based bulge with a left paracentral protrusion at L5-S1 impinging the descending nerve roots in the left lateral recess. Correlation for descending left S1 radiculopathy. Patient denies focal weakness. No recent trauma or falls. No bowel/bladder incontinence.          Review of Systems  Musculoskeletal:  Positive for back pain.  Neurological:  Negative for tingling, sensory change, focal weakness and weakness.  All other systems reviewed and are negative.  Otherwise per HPI.  Assessment & Plan: Visit Diagnoses:    ICD-10-CM   1. Chronic left-sided low back pain with left-sided sciatica  M54.42    G89.29     2. Lumbar radiculopathy  M54.16     3. Intervertebral disc disorders with radiculopathy, lumbar region  M51.16        Plan: Findings:  Chronic left sided lower back pain radiating to buttock and down leg. Patient continues to have pain despite good conservative therapies such as chiropractic treatments, home exercise regimen, rest and  use of medications. I discussed recent lumbar MRI with patient today using imaging and spine model. There is left paracentral protrusion at L5-S1 impinging the descending nerve roots in the left lateral recess. His pain pattern today does fit with S1 nerve distribution. I discussed disc herniation pathology with him today and explained to him that this finding will likely re-absorb over time. We discussed treatment plan in detail today. I offered diagnostic and hopefully therapeutic left S1 transforaminal epidural steroid injection, however he would like some time to think about his options. I explained injection procedure in detail and do feel this would be a good option for him as he has failed previous conservative treatments. I encouraged him to let us  know if he would like to proceed with injection therapy. His exam today is non focal, good strength noted to bilateral lower extremities.     Meds & Orders: No orders of the defined types were placed in this encounter.  No orders of the defined types were placed in this encounter.   Follow-up: Return if symptoms worsen or fail to improve.   Procedures: No procedures performed      Clinical History: MR LUMBAR SPINE WITHOUT IV CONTRAST   COMPARISON: None available   CLINICAL HISTORY: Acute lumbar spine myelopathy. Left-sided low back pain. Sciatica.   TECHNIQUE: SAG T2, SAG T1, SAG STIR, AX T2, AX T1 without IV contrast.   FINDINGS: There is normal alignment of the lumbar spine. For the purposes of this examination the lowest rectangular vertebral body is labeled S1. There is lumbarization of  the S1 vertebral body. There is no vertebral body height loss, subluxation or marrow replacing process. The sacrum and SI joints are unremarkable so far as visualized. Conus and cauda equina are unremarkable.   T12-L1: There is no focal disc protrusion, foraminal or spinal stenosis.   L1-2: There is mild disc desiccation and mild facet  arthrosis. No significant foraminal or spinal stenosis is present.   L2-3: There is no focal disc protrusion, foraminal or spinal stenosis. Mild facet arthrosis is present.   L3-4: There is no focal disc protrusion, foraminal or spinal stenosis. Mild facet arthrosis is present.   L4-5: Mild disc desiccation facet arthrosis. No significant foraminal or spinal stenosis.   L5-S1: There is a broad-based bulge with a left paracentral component impinging the descending nerve roots in the left lateral recess. This is best seen on sagittal image 10 and axial image 32 and 33. There is moderate facet arthrosis. Correlation for descending left S1 radicular symptoms.   The retroperitoneal structures demonstrate no significant abnormality.   IMPRESSION: There is a broad-based bulge with a left paracentral protrusion at L5-S1 impinging the descending nerve roots in the left lateral recess. Correlation for descending left S1 radiculopathy. See above for more detail.   Mild disc desiccation and multilevel facet arthrosis as described in detail by level above.   Electronically signed by: Norleen Satchel MD 10/21/2024 03:06 PM EST RP Workstation: [id number]   He reports that he has never smoked. He has never used smokeless tobacco. No results for input(s): HGBA1C, LABURIC in the last 8760 hours.  Objective:  VS:  HT:    WT:   BMI:     BP:   HR: bpm  TEMP: ( )  RESP:  Physical Exam Vitals and nursing note reviewed.  HENT:     Head: Normocephalic and atraumatic.     Right Ear: External ear normal.     Left Ear: External ear normal.     Nose: Nose normal.     Mouth/Throat:     Mouth: Mucous membranes are moist.  Eyes:     Extraocular Movements: Extraocular movements intact.  Cardiovascular:     Rate and Rhythm: Normal rate.     Pulses: Normal pulses.  Pulmonary:     Effort: Pulmonary effort is normal.  Abdominal:     General: Abdomen is flat. There is no distension.   Musculoskeletal:        General: Tenderness present.     Cervical back: Normal range of motion.     Comments: Patient rises from seated position to standing without difficulty. Good lumbar range of motion. No pain noted with facet loading. 5/5 strength noted with bilateral hip flexion, knee flexion/extension, ankle dorsiflexion/plantarflexion and EHL. No clonus noted bilaterally. No pain upon palpation of greater trochanters. No pain with internal/external rotation of bilateral hips. Sensation intact bilaterally. Dysesthesias noted to left S1 dermatome. Positive slump test on the left. Ambulates without aid, gait steady.     Skin:    General: Skin is warm and dry.     Capillary Refill: Capillary refill takes less than 2 seconds.  Neurological:     General: No focal deficit present.     Mental Status: He is alert and oriented to person, place, and time.  Psychiatric:        Mood and Affect: Mood normal.        Behavior: Behavior normal.     Ortho Exam  Imaging: No results found.  Past Medical/Family/Surgical/Social History:  Medications & Allergies reviewed per EMR, new medications updated. Patient Active Problem List   Diagnosis Date Noted   Acute left-sided low back pain with left-sided sciatica 10/04/2024   No past medical history on file. No family history on file. No past surgical history on file. Social History   Occupational History   Not on file  Tobacco Use   Smoking status: Never   Smokeless tobacco: Never  Vaping Use   Vaping status: Never Used  Substance and Sexual Activity   Alcohol use: Yes    Comment: weekly   Drug use: Never   Sexual activity: Not on file

## 2024-11-18 ENCOUNTER — Other Ambulatory Visit: Payer: Self-pay | Admitting: Physical Medicine and Rehabilitation

## 2024-11-18 ENCOUNTER — Telehealth: Payer: Self-pay | Admitting: Physical Medicine and Rehabilitation

## 2024-11-18 ENCOUNTER — Ambulatory Visit: Payer: Self-pay | Admitting: Family Medicine

## 2024-11-18 ENCOUNTER — Other Ambulatory Visit

## 2024-11-18 DIAGNOSIS — M5416 Radiculopathy, lumbar region: Secondary | ICD-10-CM

## 2024-11-18 DIAGNOSIS — R7989 Other specified abnormal findings of blood chemistry: Secondary | ICD-10-CM | POA: Diagnosis not present

## 2024-11-18 DIAGNOSIS — R748 Abnormal levels of other serum enzymes: Secondary | ICD-10-CM

## 2024-11-18 LAB — COMPREHENSIVE METABOLIC PANEL WITH GFR
ALT: 219 U/L — ABNORMAL HIGH (ref 0–53)
AST: 122 U/L — ABNORMAL HIGH (ref 0–37)
Albumin: 4.3 g/dL (ref 3.5–5.2)
Alkaline Phosphatase: 91 U/L (ref 39–117)
BUN: 9 mg/dL (ref 6–23)
CO2: 27 meq/L (ref 19–32)
Calcium: 8.9 mg/dL (ref 8.4–10.5)
Chloride: 102 meq/L (ref 96–112)
Creatinine, Ser: 0.71 mg/dL (ref 0.40–1.50)
GFR: 119.54 mL/min (ref >60.00)
Glucose, Bld: 90 mg/dL (ref 70–99)
Potassium: 3.7 meq/L (ref 3.5–5.1)
Sodium: 136 meq/L (ref 135–145)
Total Bilirubin: 0.5 mg/dL (ref 0.2–1.2)
Total Protein: 7.5 g/dL (ref 6.0–8.3)

## 2024-11-18 NOTE — Telephone Encounter (Signed)
 Patient called and said that he wants to proceed with the shot and if you could call him to schedule. CB#[phone]

## 2024-11-25 ENCOUNTER — Other Ambulatory Visit: Payer: Self-pay | Admitting: Physical Medicine and Rehabilitation

## 2024-11-25 ENCOUNTER — Telehealth: Payer: Self-pay

## 2024-11-25 MED ORDER — DIAZEPAM 5 MG PO TABS: ORAL_TABLET | ORAL | 0 refills | Status: AC

## 2024-11-25 NOTE — Telephone Encounter (Signed)
 Patient requesting Valium for injection in January, Send to [facility] on [address]

## 2024-12-03 ENCOUNTER — Inpatient Hospital Stay: Admission: RE | Admit: 2024-12-03 | Discharge: 2024-12-03 | Attending: Family Medicine

## 2024-12-03 ENCOUNTER — Encounter: Payer: Self-pay | Admitting: Family Medicine

## 2024-12-03 DIAGNOSIS — R748 Abnormal levels of other serum enzymes: Secondary | ICD-10-CM

## 2024-12-06 ENCOUNTER — Ambulatory Visit: Payer: Self-pay | Admitting: Family Medicine

## 2024-12-06 DIAGNOSIS — K760 Fatty (change of) liver, not elsewhere classified: Secondary | ICD-10-CM

## 2024-12-22 ENCOUNTER — Encounter: Payer: Self-pay | Admitting: Family Medicine

## 2024-12-23 ENCOUNTER — Encounter: Payer: Self-pay | Admitting: Physical Medicine and Rehabilitation

## 2024-12-23 ENCOUNTER — Other Ambulatory Visit: Payer: Self-pay

## 2024-12-23 ENCOUNTER — Ambulatory Visit: Admitting: Physical Medicine and Rehabilitation

## 2024-12-23 VITALS — BP 120/78 | HR 63

## 2024-12-23 DIAGNOSIS — M5416 Radiculopathy, lumbar region: Secondary | ICD-10-CM

## 2024-12-23 MED ORDER — METHYLPREDNISOLONE ACETATE 40 MG/ML IJ SUSP
40.0000 mg | Freq: Once | INTRAMUSCULAR | Status: AC
  Administered 2024-12-23: 40 mg

## 2024-12-23 NOTE — Procedures (Signed)
 S1 Lumbosacral Transforaminal Epidural Steroid Injection - Sub-Pedicular Approach with Fluoroscopic Guidance   Patient: Andre Bryant      Date of Birth: [birth year] MRN: [id number] PCP: Alvia Corean CROME, FNP      Visit Date: 12/23/2024   Universal Protocol:    Date/Time: 1/8/20268:21 AM  Consent Given By: the patient  Position:  PRONE  Additional Comments: Vital signs were monitored before and after the procedure. Patient was prepped and draped in the usual sterile fashion. The correct patient, procedure, and site was verified.   Injection Procedure Details:  Procedure Site One Meds Administered:  Meds ordered this encounter  Medications   methylPREDNISolone  acetate (DEPO-MEDROL ) injection 40 mg    Laterality: Left  Location/Site:  S1 Foramen   Needle size: 22 ga.  Needle type: Spinal  Needle Placement: Transforaminal  Findings:   -Comments: Excellent flow of contrast along the nerve, nerve root and into the epidural space.  Epidurogram: Contrast epidurogram showed no nerve root cut off or restricted flow pattern.  Procedure Details: After squaring off the sacral end-plate to get a true AP view, the C-arm was positioned so that the best possible view of the S1 foramen was visualized. The soft tissues overlying this structure were infiltrated with 2-3 ml. of 1% Lidocaine  without Epinephrine.    The spinal needle was inserted toward the target using a trajectory view along the fluoroscope beam.  Under AP and lateral visualization, the needle was advanced so it did not puncture dura. Biplanar projections were used to confirm position. Aspiration was confirmed to be negative for CSF and/or blood. A 1-2 ml. volume of Isovue-250 was injected and flow of contrast was noted at each level. Radiographs were obtained for documentation purposes.   After attaining the desired flow of contrast documented above, a 0.5 to 1.0 ml test dose of 0.25% Marcaine was injected into  each respective transforaminal space.  The patient was observed for 90 seconds post injection.  After no sensory deficits were reported, and normal lower extremity motor function was noted,   the above injectate was administered so that equal amounts of the injectate were placed at each foramen (level) into the transforaminal epidural space.   Additional Comments:  The patient tolerated the procedure well Dressing: Band-Aid with 2 x 2 sterile gauze    Post-procedure details: Patient was observed during the procedure. Post-procedure instructions were reviewed.  Patient left the clinic in stable condition.

## 2024-12-23 NOTE — Progress Notes (Signed)
 "  Andre Bryant - 34 y.o. male MRN [id number]  Date of birth: April 02, 1991  Office Visit Note: Visit Date: 12/23/2024 PCP: Alvia Corean CROME, FNP Referred by: Alvia Corean CROME, FNP  Subjective: Chief Complaint  Patient presents with   Lower Back - Pain   HPI:  Andre Bryant is a 34 y.o. male who comes in today at the request of Duwaine Pouch, FNP for planned Left S1-2 Lumbar Transforaminal epidural steroid injection with fluoroscopic guidance.  The patient has failed conservative care including home exercise, medications, time and activity modification.  This injection will be diagnostic and hopefully therapeutic.  Please see requesting physician notes for further details and justification.   ROS Otherwise per HPI.  Assessment & Plan: Visit Diagnoses:    ICD-10-CM   1. Lumbar radiculopathy  M54.16 XR C-ARM NO REPORT    Epidural Steroid injection    methylPREDNISolone  acetate (DEPO-MEDROL ) injection 40 mg      Plan: No additional findings.   Meds & Orders:  Meds ordered this encounter  Medications   methylPREDNISolone  acetate (DEPO-MEDROL ) injection 40 mg    Orders Placed This Encounter  Procedures   XR C-ARM NO REPORT   Epidural Steroid injection    Follow-up: Return for visit to requesting provider as needed.   Procedures: No procedures performed  S1 Lumbosacral Transforaminal Epidural Steroid Injection - Sub-Pedicular Approach with Fluoroscopic Guidance   Patient: Andre Bryant      Date of Birth: 08-24-1991 MRN: [id number] PCP: Alvia Corean CROME, FNP      Visit Date: 12/23/2024   Universal Protocol:    Date/Time: 1/8/20268:21 AM  Consent Given By: the patient  Position:  PRONE  Additional Comments: Vital signs were monitored before and after the procedure. Patient was prepped and draped in the usual sterile fashion. The correct patient, procedure, and site was verified.   Injection Procedure Details:  Procedure Site One Meds  Administered:  Meds ordered this encounter  Medications   methylPREDNISolone  acetate (DEPO-MEDROL ) injection 40 mg    Laterality: Left  Location/Site:  S1 Foramen   Needle size: 22 ga.  Needle type: Spinal  Needle Placement: Transforaminal  Findings:   -Comments: Excellent flow of contrast along the nerve, nerve root and into the epidural space.  Epidurogram: Contrast epidurogram showed no nerve root cut off or restricted flow pattern.  Procedure Details: After squaring off the sacral end-plate to get a true AP view, the C-arm was positioned so that the best possible view of the S1 foramen was visualized. The soft tissues overlying this structure were infiltrated with 2-3 ml. of 1% Lidocaine  without Epinephrine.    The spinal needle was inserted toward the target using a trajectory view along the fluoroscope beam.  Under AP and lateral visualization, the needle was advanced so it did not puncture dura. Biplanar projections were used to confirm position. Aspiration was confirmed to be negative for CSF and/or blood. A 1-2 ml. volume of Isovue-250 was injected and flow of contrast was noted at each level. Radiographs were obtained for documentation purposes.   After attaining the desired flow of contrast documented above, a 0.5 to 1.0 ml test dose of 0.25% Marcaine was injected into each respective transforaminal space.  The patient was observed for 90 seconds post injection.  After no sensory deficits were reported, and normal lower extremity motor function was noted,   the above injectate was administered so that equal amounts of the injectate were placed at each foramen (level) into  the transforaminal epidural space.   Additional Comments:  The patient tolerated the procedure well Dressing: Band-Aid with 2 x 2 sterile gauze    Post-procedure details: Patient was observed during the procedure. Post-procedure instructions were reviewed.  Patient left the clinic in stable  condition.   Clinical History: MR LUMBAR SPINE WITHOUT IV CONTRAST   COMPARISON: None available   CLINICAL HISTORY: Acute lumbar spine myelopathy. Left-sided low back pain. Sciatica.   TECHNIQUE: SAG T2, SAG T1, SAG STIR, AX T2, AX T1 without IV contrast.   FINDINGS: There is normal alignment of the lumbar spine. For the purposes of this examination the lowest rectangular vertebral body is labeled S1. There is lumbarization of the S1 vertebral body. There is no vertebral body height loss, subluxation or marrow replacing process. The sacrum and SI joints are unremarkable so far as visualized. Conus and cauda equina are unremarkable.   T12-L1: There is no focal disc protrusion, foraminal or spinal stenosis.   L1-2: There is mild disc desiccation and mild facet arthrosis. No significant foraminal or spinal stenosis is present.   L2-3: There is no focal disc protrusion, foraminal or spinal stenosis. Mild facet arthrosis is present.   L3-4: There is no focal disc protrusion, foraminal or spinal stenosis. Mild facet arthrosis is present.   L4-5: Mild disc desiccation facet arthrosis. No significant foraminal or spinal stenosis.   L5-S1: There is a broad-based bulge with a left paracentral component impinging the descending nerve roots in the left lateral recess. This is best seen on sagittal image 10 and axial image 32 and 33. There is moderate facet arthrosis. Correlation for descending left S1 radicular symptoms.   The retroperitoneal structures demonstrate no significant abnormality.   IMPRESSION: There is a broad-based bulge with a left paracentral protrusion at L5-S1 impinging the descending nerve roots in the left lateral recess. Correlation for descending left S1 radiculopathy. See above for more detail.   Mild disc desiccation and multilevel facet arthrosis as described in detail by level above.   Electronically signed by: Norleen Satchel MD 10/21/2024 03:06 PM EST  RP Workstation: [id number]     Objective:  VS:  HT:    WT:   BMI:     BP:120/78  HR:63bpm  TEMP: ( )  RESP:  Physical Exam Vitals and nursing note reviewed.  Constitutional:      General: He is not in acute distress.    Appearance: Normal appearance. He is not ill-appearing.  HENT:     Head: Normocephalic and atraumatic.     Right Ear: External ear normal.     Left Ear: External ear normal.     Nose: No congestion.  Eyes:     Extraocular Movements: Extraocular movements intact.  Cardiovascular:     Rate and Rhythm: Normal rate.     Pulses: Normal pulses.  Pulmonary:     Effort: Pulmonary effort is normal. No respiratory distress.  Abdominal:     General: There is no distension.     Palpations: Abdomen is soft.  Musculoskeletal:        General: No tenderness or signs of injury.     Cervical back: Neck supple.     Right lower leg: No edema.     Left lower leg: No edema.     Comments: Patient has good distal strength without clonus.  Skin:    Findings: No erythema or rash.  Neurological:     General: No focal deficit present.     Mental  Status: He is alert and oriented to person, place, and time.     Sensory: No sensory deficit.     Motor: No weakness or abnormal muscle tone.     Coordination: Coordination normal.  Psychiatric:        Mood and Affect: Mood normal.        Behavior: Behavior normal.      Imaging: No results found. "

## 2024-12-23 NOTE — Progress Notes (Signed)
 Pain Scale   Average Pain 4 Patient advising he has lower back pain that radiates to left leg pain is constant.        +Driver, -BT, -Dye Allergies.
